# Patient Record
Sex: Male | Born: 1985 | Race: Black or African American | Hispanic: No | Marital: Single | Smoking: Current some day smoker
Health system: Southern US, Community
[De-identification: ages and names within clinical notes are randomized; demographics above are authoritative.]

---

## 2009-01-08 ENCOUNTER — Emergency Department: Payer: Self-pay | Admitting: Emergency Medicine

## 2009-01-15 ENCOUNTER — Emergency Department: Payer: Self-pay | Admitting: Emergency Medicine

## 2012-04-29 ENCOUNTER — Emergency Department: Payer: Self-pay | Admitting: Emergency Medicine

## 2013-07-18 ENCOUNTER — Emergency Department: Payer: Self-pay | Admitting: Emergency Medicine

## 2013-07-18 LAB — COMPREHENSIVE METABOLIC PANEL
Albumin: 3.9 g/dL (ref 3.4–5.0)
Alkaline Phosphatase: 75 U/L (ref 50–136)
Anion Gap: 5 — ABNORMAL LOW (ref 7–16)
BUN: 10 mg/dL (ref 7–18)
Chloride: 106 mmol/L (ref 98–107)
Co2: 24 mmol/L (ref 21–32)
Osmolality: 269 (ref 275–301)
SGPT (ALT): 29 U/L (ref 12–78)
Sodium: 135 mmol/L — ABNORMAL LOW (ref 136–145)
Total Protein: 7.5 g/dL (ref 6.4–8.2)

## 2013-07-18 LAB — URINALYSIS, COMPLETE
Bacteria: NONE SEEN
Ketone: NEGATIVE
Leukocyte Esterase: NEGATIVE
RBC,UR: 3 /HPF (ref 0–5)
WBC UR: 1 /HPF (ref 0–5)

## 2013-07-18 LAB — DIFFERENTIAL
Basophil %: 0.6 %
Eosinophil #: 0.1 10*3/uL (ref 0.0–0.7)
Lymphocyte %: 6.3 %
Monocyte %: 8.2 %
Neutrophil #: 18.1 10*3/uL — ABNORMAL HIGH (ref 1.4–6.5)
Neutrophil %: 84.4 %

## 2013-07-18 LAB — CBC
MCHC: 33.9 g/dL (ref 32.0–36.0)
MCV: 85 fL (ref 80–100)
Platelet: 311 10*3/uL (ref 150–440)
WBC: 21.5 10*3/uL — ABNORMAL HIGH (ref 3.8–10.6)

## 2013-07-18 LAB — SEDIMENTATION RATE: Erythrocyte Sed Rate: 4 mm/hr (ref 0–15)

## 2013-07-23 LAB — CULTURE, BLOOD (SINGLE)

## 2015-11-07 ENCOUNTER — Encounter: Payer: Self-pay | Admitting: Emergency Medicine

## 2015-11-07 ENCOUNTER — Emergency Department: Payer: Self-pay

## 2015-11-07 ENCOUNTER — Emergency Department
Admission: EM | Admit: 2015-11-07 | Discharge: 2015-11-08 | Disposition: A | Discharge status: Home / Self Care | Payer: Self-pay | Attending: Emergency Medicine | Admitting: Emergency Medicine

## 2015-11-07 DIAGNOSIS — R1033 Periumbilical pain: Secondary | ICD-10-CM | POA: Insufficient documentation

## 2015-11-07 DIAGNOSIS — F17200 Nicotine dependence, unspecified, uncomplicated: Secondary | ICD-10-CM | POA: Insufficient documentation

## 2015-11-07 DIAGNOSIS — R109 Unspecified abdominal pain: Secondary | ICD-10-CM

## 2015-11-07 LAB — COMPREHENSIVE METABOLIC PANEL
ALBUMIN: 4.9 g/dL (ref 3.5–5.0)
ALT: 27 U/L (ref 17–63)
ANION GAP: 10 (ref 5–15)
AST: 32 U/L (ref 15–41)
Alkaline Phosphatase: 58 U/L (ref 38–126)
BUN: 11 mg/dL (ref 6–20)
CALCIUM: 9.8 mg/dL (ref 8.9–10.3)
CO2: 18 mmol/L — AB (ref 22–32)
Chloride: 107 mmol/L (ref 101–111)
Creatinine, Ser: 0.87 mg/dL (ref 0.61–1.24)
GFR calc non Af Amer: >60 mL/min (ref >60)
GLUCOSE: 176 mg/dL — AB (ref 65–99)
POTASSIUM: 4.1 mmol/L (ref 3.5–5.1)
SODIUM: 135 mmol/L (ref 135–145)
TOTAL PROTEIN: 8.4 g/dL — AB (ref 6.5–8.1)
Total Bilirubin: 1 mg/dL (ref 0.3–1.2)

## 2015-11-07 LAB — CBC
HEMATOCRIT: 48.7 % (ref 40.0–52.0)
HEMOGLOBIN: 16.1 g/dL (ref 13.0–18.0)
MCH: 28.1 pg (ref 26.0–34.0)
MCHC: 33 g/dL (ref 32.0–36.0)
MCV: 85.1 fL (ref 80.0–100.0)
Platelets: 350 10*3/uL (ref 150–440)
RBC: 5.72 MIL/uL (ref 4.40–5.90)
RDW: 14 % (ref 11.5–14.5)
WBC: 15.5 10*3/uL — ABNORMAL HIGH (ref 3.8–10.6)

## 2015-11-07 LAB — LIPASE, BLOOD: Lipase: 26 U/L (ref 11–51)

## 2015-11-07 MED ORDER — ONDANSETRON HCL 4 MG/2ML IJ SOLN
4.0000 mg | Freq: Once | INTRAMUSCULAR | Status: AC
  Administered 2015-11-07: 4 mg via INTRAVENOUS
  Filled 2015-11-07: qty 2

## 2015-11-07 MED ORDER — IOHEXOL 240 MG/ML SOLN
25.0000 mL | Freq: Once | INTRAMUSCULAR | Status: AC | PRN
  Administered 2015-11-07: 25 mL via ORAL

## 2015-11-07 MED ORDER — MORPHINE SULFATE (PF) 4 MG/ML IV SOLN
4.0000 mg | Freq: Once | INTRAVENOUS | Status: AC
  Administered 2015-11-07: 4 mg via INTRAVENOUS
  Filled 2015-11-07: qty 1

## 2015-11-07 MED ORDER — IOHEXOL 350 MG/ML SOLN
100.0000 mL | Freq: Once | INTRAVENOUS | Status: AC | PRN
  Administered 2015-11-07: 100 mL via INTRAVENOUS

## 2015-11-07 MED ORDER — SODIUM CHLORIDE 0.9 % IV BOLUS (SEPSIS)
1000.0000 mL | Freq: Once | INTRAVENOUS | Status: AC
  Administered 2015-11-07: 1000 mL via INTRAVENOUS

## 2015-11-07 NOTE — ED Notes (Signed)
Patient reports abdominal pain, unable to have a bowel movement and dry heaving for 2 days.

## 2015-11-07 NOTE — ED Provider Notes (Signed)
[facility] Emergency Department Provider Note   ____________________________________________  Time seen: Approximately 9:45 PM I have reviewed the triage vital signs and the triage nursing note.  HISTORY  Chief Complaint Constipation and Abdominal Pain   Historian Patient  HPI Chris Powers is a 30 y.o. male with no significant past medical history, but does state he has had consultation before, is here for left-sided abdominal pain for about 2 days. He states he has nausea with dry heaving. No diarrhea. No bowel movement for 2 days. He thinks he could have constipation. No fevers. No chest pain or trouble breathing. Pain is moderate to severe. No exacerbating or alleviating factors    History reviewed. No pertinent past medical history.  There are no active problems to display for this patient.   History reviewed. No pertinent past surgical history.  Current Outpatient Rx  Name  Route  Sig  Dispense  Refill  . dicyclomine (BENTYL) 20 MG tablet   Oral   Take 1 tablet (20 mg total) by mouth 3 (three) times daily as needed (abdominal pain).   30 tablet   0   . ondansetron (ZOFRAN) 4 MG tablet   Oral   Take 1 tablet (4 mg total) by mouth every 8 (eight) hours as needed for nausea or vomiting.   20 tablet   0     Allergies Review of patient's allergies indicates no known allergies.  No family history on file.  Social History Social History  Substance Use Topics  . Smoking status: Current Some Day Smoker  . Smokeless tobacco: Never Used  . Alcohol Use: Yes    Review of Systems  Constitutional: Negative for fever. Eyes: Negative for visual changes. ENT: Negative for sore throat. Cardiovascular: Negative for chest pain. Respiratory: Negative for shortness of breath. Gastrointestinal: Negative for diarrhea. Genitourinary: Negative for dysuria. Musculoskeletal: Negative for back pain. Skin: Negative for rash. Neurological: Negative for  headache. 10 point Review of Systems otherwise negative ____________________________________________   PHYSICAL EXAM:  VITAL SIGNS: ED Triage Vitals  Enc Vitals Group     BP 11/07/15 2029 152/93 mmHg     Pulse Rate 11/07/15 2029 57     Resp 11/07/15 2029 20     Temp 11/07/15 2029 99 F (37.2 C)     Temp Source 11/07/15 2029 Oral     SpO2 11/07/15 2029 96 %     Weight 11/07/15 2029 205 lb (92.987 kg)     Height 11/07/15 2029  (1.727 m)     Head Cir --      Peak Flow --      Pain Score 11/07/15 2030 7     Pain Loc --      Pain Edu? --      Excl. in GC? --      Constitutional: Alert and oriented. Well appearing overall complaining of pain. Eyes: Conjunctivae are normal. PERRL. Normal extraocular movements. ENT   Head: Normocephalic and atraumatic.   Nose: No congestion/rhinnorhea.   Mouth/Throat: Mucous membranes are moist.   Neck: No stridor. Cardiovascular/Chest: Normal rate, regular rhythm.  No murmurs, rubs, or gallops. Respiratory: Normal respiratory effort without tachypnea nor retractions. Breath sounds are clear and equal bilaterally. No wheezes/rales/rhonchi. Gastrointestinal: Soft. No distention, no guarding, no rebound. Moderate tenderness in the periumbilical and left-sided abdomen  Genitourinary/rectal:Deferred Musculoskeletal: Nontender with normal range of motion in all extremities. No joint effusions.  No lower extremity tenderness.  No edema. Neurologic:  Normal speech and  language. No gross or focal neurologic deficits are appreciated. Skin:  Skin is warm, dry and intact. No rash noted. Psychiatric: Mood and affect are normal. Speech and behavior are normal. Patient exhibits appropriate insight and judgment.  ____________________________________________   EKG I, Governor Rooks, MD, the attending physician have personally viewed and interpreted all ECGs.  None ____________________________________________  LABS (pertinent  positives/negatives)  Lipase 26 Comprehensive metabolic panel significant only for CO2 18 White blood count 15.5, hemoglobin 16.1 and platelet count 350  ____________________________________________  RADIOLOGY All Xrays were viewed by me. Imaging interpreted by Radiologist.  The abdomen and pelvis with contrast: Pending __________________________________________  PROCEDURES  Procedure(s) performed: None  Critical Care performed: None  ____________________________________________   ED COURSE / ASSESSMENT AND PLAN  Pertinent labs & imaging results that were available during my care of the patient were reviewed by me and considered in my medical decision making (see chart for details).    Significant amount of abdominal pain, with elevated white blood cell count, I do think it's prudent to obtain CT scan. We discussed risks and benefits and chose to proceed.   Patient care transferred to Dr. Derrill Kay at shift change 11 PM. CT scan pending. Disposition depending on CT scan result.  CONSULTATIONS:   None   Patient / Family / Caregiver informed of clinical course, medical decision-making process, and agree with plan.  ___________________________________________   FINAL CLINICAL IMPRESSION(S) / ED DIAGNOSES   Final diagnoses:  Left sided abdominal pain              Note: This dictation was prepared with Dragon dictation. Any transcriptional errors that result from this process are unintentional   Governor Rooks, MD 11/11/15 [phone]

## 2015-11-07 NOTE — ED Notes (Signed)
Pt sats dropped x 2 to 80's. Pt sleeping soundly in bed after morphine. Placed on 2L [place] at this time. MD Shaune Pollack made aware. No further orders given at this time

## 2015-11-08 MED ORDER — DICYCLOMINE HCL 20 MG PO TABS: 20.0000 mg | ORAL_TABLET | Freq: Three times a day (TID) | ORAL | Status: AC | PRN

## 2015-11-08 MED ORDER — ONDANSETRON HCL 4 MG PO TABS: 4.0000 mg | ORAL_TABLET | Freq: Three times a day (TID) | ORAL | Status: AC | PRN

## 2015-11-08 NOTE — ED Provider Notes (Signed)
-----------------------------------------   12:11 AM on 11/08/2015 -----------------------------------------   CT abd/pel IMPRESSION: Diffuse colonic diverticulosis, particular worse in the descending colon, without evidence of diverticulitis.  No abnormalities within the solid abdominal organs.  On my exam at this time abdomen is soft, non tender. Patient does state that he feels better. Will plan on discharging with antiemetics and bentyl. Discussed return precautions with the patient.   Phineas Semen, MD 11/08/15 [phone]

## 2015-11-08 NOTE — Discharge Instructions (Signed)
Please seek medical attention for any high fevers, chest pain, shortness of breath, change in behavior, persistent vomiting, bloody stool or any other new or concerning symptoms. ° ° °Abdominal Pain, Adult °Many things can cause abdominal pain. Usually, abdominal pain is not caused by a disease and will improve without treatment. It can often be observed and treated at home. Your health care provider will do a physical exam and possibly order blood tests and X-rays to help determine the seriousness of your pain. However, in many cases, more time must pass before a clear cause of the pain can be found. Before that point, your health care provider may not know if you need more testing or further treatment. °HOME CARE INSTRUCTIONS °Monitor your abdominal pain for any changes. The following actions may help to alleviate any discomfort you are experiencing: °· Only take over-the-counter or prescription medicines as directed by your health care provider. °· Do not take laxatives unless directed to do so by your health care provider. °· Try a clear liquid diet (broth, tea, or water) as directed by your health care provider. Slowly move to a bland diet as tolerated. °SEEK MEDICAL CARE IF: °· You have unexplained abdominal pain. °· You have abdominal pain associated with nausea or diarrhea. °· You have pain when you urinate or have a bowel movement. °· You experience abdominal pain that wakes you in the night. °· You have abdominal pain that is worsened or improved by eating food. °· You have abdominal pain that is worsened with eating fatty foods. °· You have a fever. °SEEK IMMEDIATE MEDICAL CARE IF: °· Your pain does not go away within 2 hours. °· You keep throwing up (vomiting). °· Your pain is felt only in portions of the abdomen, such as the right side or the left lower portion of the abdomen. °· You pass bloody or black tarry stools. °MAKE SURE YOU: °· Understand these instructions. °· Will watch your  condition. °· Will get help right away if you are not doing well or get worse. °  °This information is not intended to replace advice given to you by your health care provider. Make sure you discuss any questions you have with your health care provider. °  °Document Released: 07/14/2005 Document Revised: 06/25/2015 Document Reviewed: 06/13/2013 °Elsevier Interactive Patient Education ©2016 [employer] ° °

## 2016-12-27 ENCOUNTER — Emergency Department: Payer: No Typology Code available for payment source

## 2016-12-27 ENCOUNTER — Encounter: Payer: Self-pay | Admitting: *Deleted

## 2016-12-27 ENCOUNTER — Emergency Department
Admission: EM | Admit: 2016-12-27 | Discharge: 2016-12-27 | Disposition: A | Discharge status: Home / Self Care | Payer: No Typology Code available for payment source | Attending: Emergency Medicine | Admitting: Emergency Medicine

## 2016-12-27 DIAGNOSIS — R0789 Other chest pain: Secondary | ICD-10-CM | POA: Insufficient documentation

## 2016-12-27 DIAGNOSIS — R1012 Left upper quadrant pain: Secondary | ICD-10-CM | POA: Diagnosis not present

## 2016-12-27 DIAGNOSIS — Y9389 Activity, other specified: Secondary | ICD-10-CM | POA: Diagnosis not present

## 2016-12-27 DIAGNOSIS — F17200 Nicotine dependence, unspecified, uncomplicated: Secondary | ICD-10-CM | POA: Diagnosis not present

## 2016-12-27 DIAGNOSIS — Y999 Unspecified external cause status: Secondary | ICD-10-CM | POA: Diagnosis not present

## 2016-12-27 DIAGNOSIS — Y92410 Unspecified street and highway as the place of occurrence of the external cause: Secondary | ICD-10-CM | POA: Insufficient documentation

## 2016-12-27 DIAGNOSIS — S299XXA Unspecified injury of thorax, initial encounter: Secondary | ICD-10-CM | POA: Diagnosis present

## 2016-12-27 MED ORDER — IBUPROFEN 600 MG PO TABS
600.0000 mg | ORAL_TABLET | Freq: Once | ORAL | Status: AC
  Administered 2016-12-27: 600 mg via ORAL
  Filled 2016-12-27: qty 1

## 2016-12-27 NOTE — ED Notes (Signed)
EDP at bedside  
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
## 2017-07-19 IMAGING — CT CT ABD-PELV W/ CM
1 of 3 series · 13 of 32 positions shown, 19 images · IV contrast (omnipaque)
Comparison: 07/18/2013

CLINICAL DATA: Left upper and left lower quadrant abdominal pain.
Unable to have bowel movement.

EXAM:
CT ABDOMEN AND PELVIS WITH CONTRAST
TECHNIQUE: Multidetector CT imaging of the abdomen and pelvis was performed
using the standard protocol following bolus administration of
intravenous contrast.
CONTRAST:  100mL OMNIPAQUE IOHEXOL 350 MG/ML SOLN

[Series 2: routine abd pel with · axial · 0.68mm/px · z∈[-374,+22]mm · 13 of 93 slices shown, 19 images]
[im 7/93  soft-tissue]
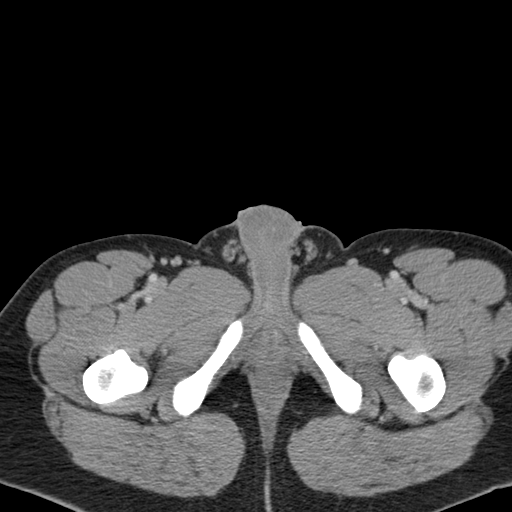
[im 7/93  bone]
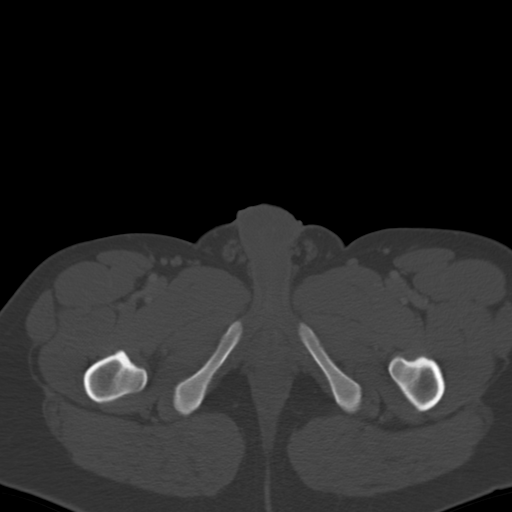
[im 14/93  soft-tissue]
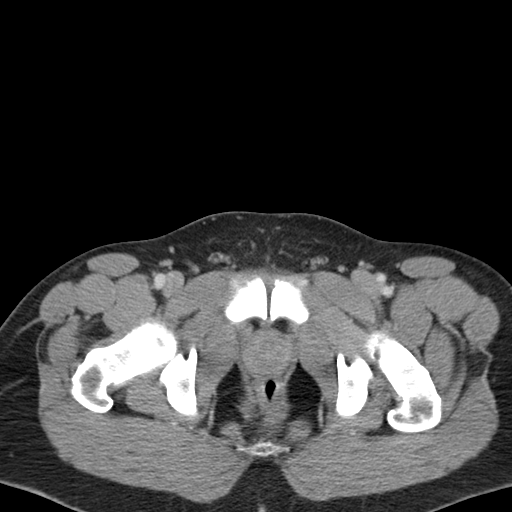
[im 20/93  soft-tissue]
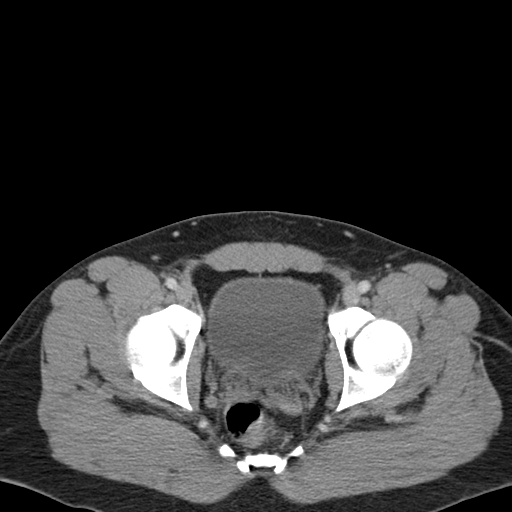
[im 27/93  soft-tissue]
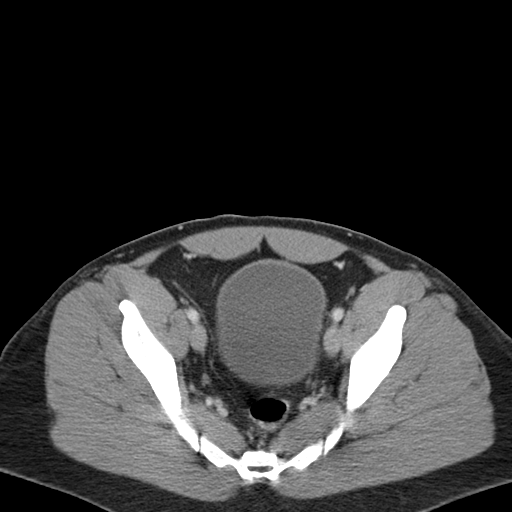
[im 33/93  soft-tissue]
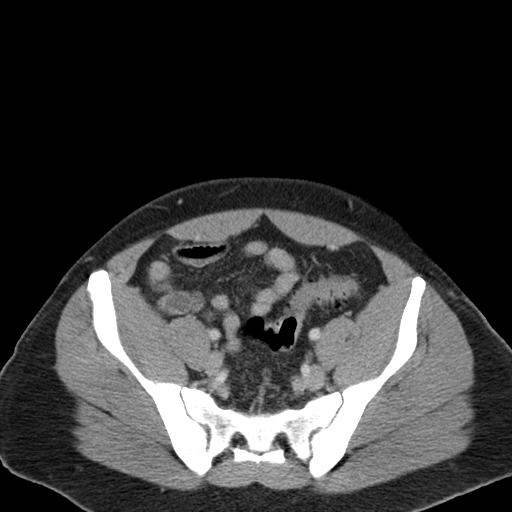
[im 40/93  soft-tissue]
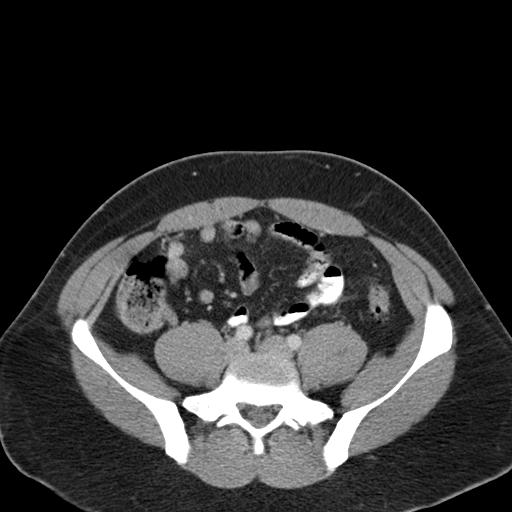
[im 47/93  soft-tissue]
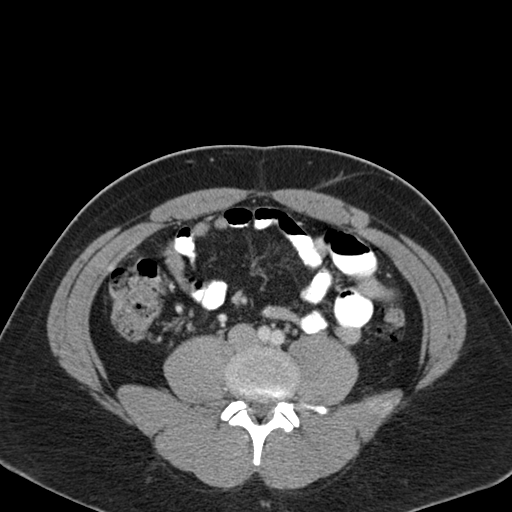
[im 53/93  soft-tissue]
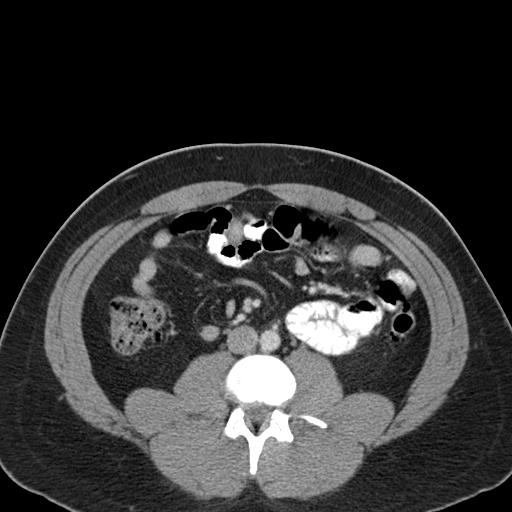
[im 60/93  soft-tissue]
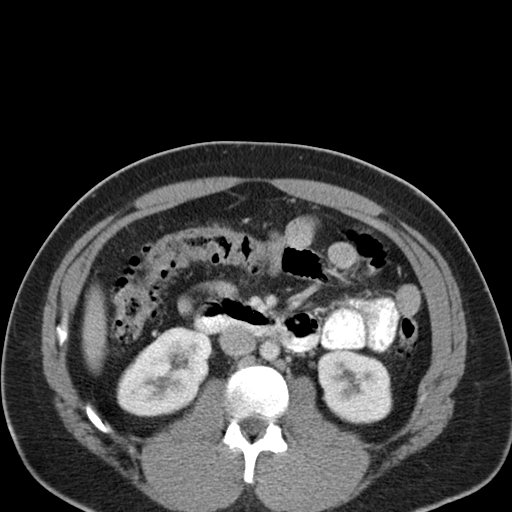
[im 60/93  bone]
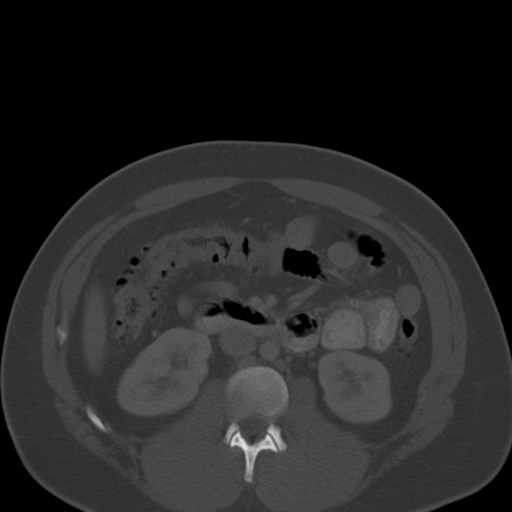
[im 66/93  soft-tissue]
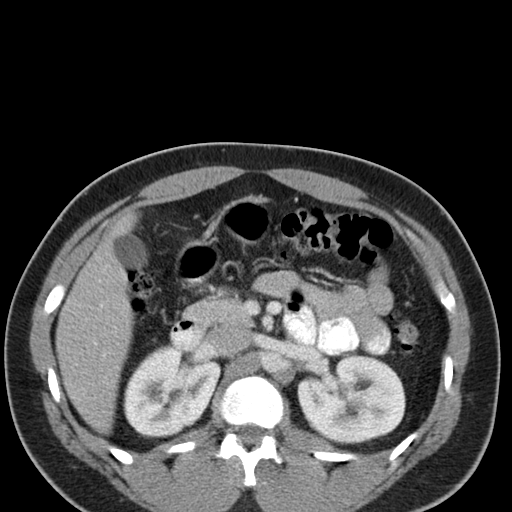
[im 66/93  lung]
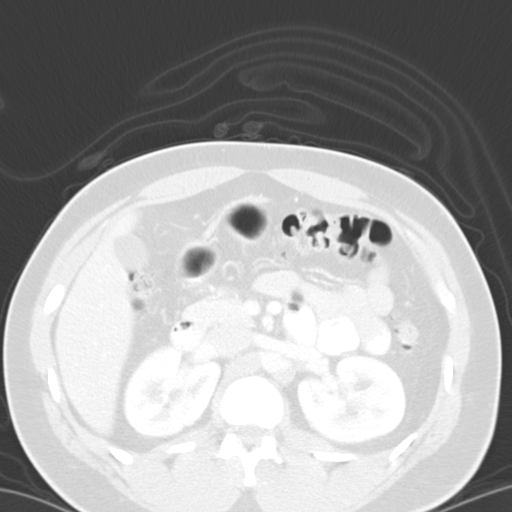
[im 73/93  soft-tissue]
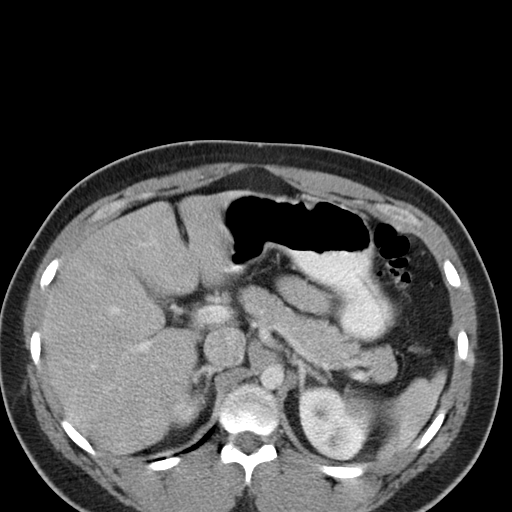
[im 73/93  lung]
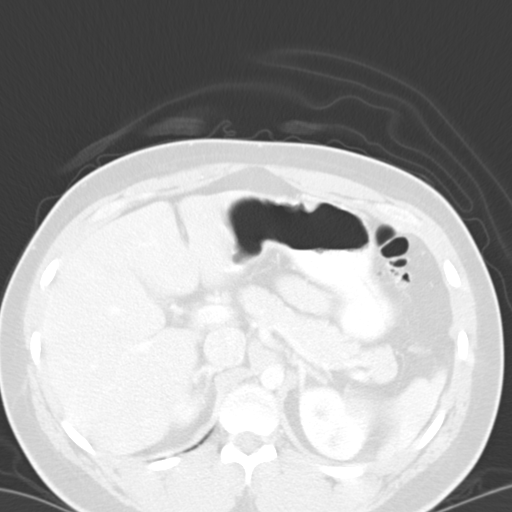
[im 79/93  soft-tissue]
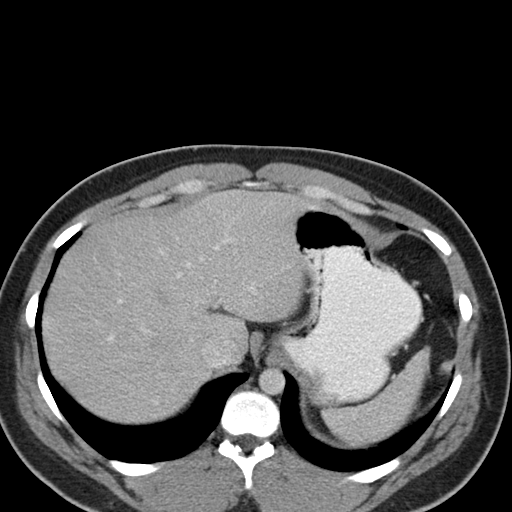
[im 79/93  lung]
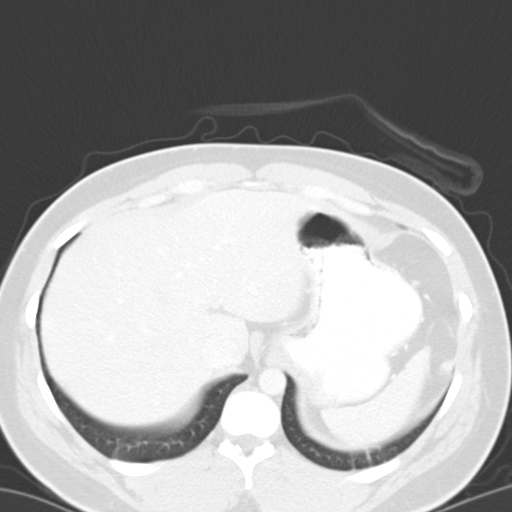
[im 86/93  soft-tissue]
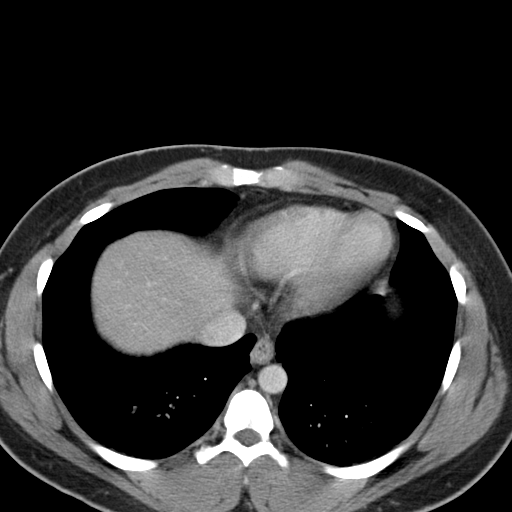
[im 86/93  lung]
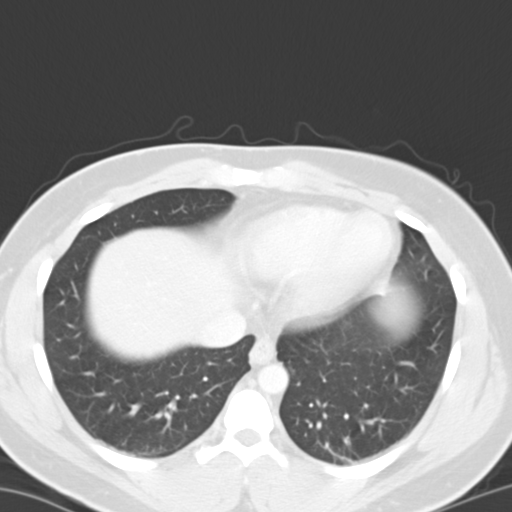

[13 of 32 positions shown; findings below may reference images not displayed]

FINDINGS: Lower chest:  No acute findings.

Hepatobiliary: No masses or other significant abnormality.

Pancreas: No mass, inflammatory changes, or other significant
abnormality.

Spleen: Within normal limits in size and appearance.

Adrenals/Urinary Tract: No masses identified. No evidence of
hydronephrosis.

Stomach/Bowel: No evidence of obstruction, inflammatory process, or
abnormal fluid collections. The appendix is normal. There is diffuse
colonic diverticulosis without evidence of diverticulitis.

Vascular/Lymphatic: No pathologically enlarged lymph nodes. No
evidence of abdominal aortic aneurysm.

Reproductive: No mass or other significant abnormality.

Other: None.

Musculoskeletal:  No suspicious bone lesions identified.
IMPRESSION: Diffuse colonic diverticulosis, particular worse in the descending
colon, without evidence of diverticulitis.

No abnormalities within the solid abdominal organs.
